# Patient Record
Sex: Female | Born: 1965 | Race: White | Hispanic: No | Smoking: Current every day smoker
Health system: Southern US, Community
[De-identification: ages and names within clinical notes are randomized; demographics above are authoritative.]

## PROBLEM LIST (undated history)

## (undated) DIAGNOSIS — E785 Hyperlipidemia, unspecified: Secondary | ICD-10-CM

## (undated) DIAGNOSIS — C4490 Unspecified malignant neoplasm of skin, unspecified: Secondary | ICD-10-CM

## (undated) HISTORY — PX: APPENDECTOMY: SHX54

## (undated) HISTORY — DX: Hyperlipidemia, unspecified: E78.5

## (undated) HISTORY — DX: Unspecified malignant neoplasm of skin, unspecified: C44.90

---

## 1993-10-28 HISTORY — PX: ABDOMINAL HYSTERECTOMY: SHX81

## 1993-11-27 LAB — HM PAP SMEAR: HM Pap smear: NORMAL

## 2005-07-18 ENCOUNTER — Other Ambulatory Visit: Payer: Self-pay

## 2005-07-18 ENCOUNTER — Emergency Department: Payer: Self-pay | Admitting: Unknown Physician Specialty

## 2007-02-19 ENCOUNTER — Emergency Department: Payer: Self-pay | Admitting: Emergency Medicine

## 2008-06-26 ENCOUNTER — Emergency Department: Payer: Self-pay | Admitting: Emergency Medicine

## 2008-08-09 ENCOUNTER — Ambulatory Visit: Payer: Self-pay | Admitting: Internal Medicine

## 2009-03-05 ENCOUNTER — Emergency Department: Payer: Self-pay | Admitting: Emergency Medicine

## 2010-10-25 ENCOUNTER — Emergency Department: Payer: Self-pay | Admitting: Emergency Medicine

## 2010-10-28 DIAGNOSIS — C4490 Unspecified malignant neoplasm of skin, unspecified: Secondary | ICD-10-CM

## 2010-10-28 HISTORY — DX: Unspecified malignant neoplasm of skin, unspecified: C44.90

## 2011-01-31 ENCOUNTER — Emergency Department: Payer: Self-pay | Admitting: Unknown Physician Specialty

## 2011-06-28 ENCOUNTER — Ambulatory Visit (INDEPENDENT_AMBULATORY_CARE_PROVIDER_SITE_OTHER): Payer: 59 | Admitting: Internal Medicine

## 2011-06-28 ENCOUNTER — Encounter: Payer: Self-pay | Admitting: Internal Medicine

## 2011-06-28 DIAGNOSIS — N959 Unspecified menopausal and perimenopausal disorder: Secondary | ICD-10-CM

## 2011-06-28 DIAGNOSIS — Z0000 Encounter for general adult medical examination without abnormal findings: Secondary | ICD-10-CM

## 2011-06-28 DIAGNOSIS — E785 Hyperlipidemia, unspecified: Secondary | ICD-10-CM

## 2011-06-28 LAB — COMPREHENSIVE METABOLIC PANEL
Albumin: 4.1 g/dL (ref 3.5–5.2)
BUN: 9 mg/dL (ref 6–23)
Calcium: 9.3 mg/dL (ref 8.4–10.5)
Chloride: 106 mEq/L (ref 96–112)
Glucose, Bld: 87 mg/dL (ref 70–99)
Potassium: 3.7 mEq/L (ref 3.5–5.1)

## 2011-06-28 LAB — LIPID PANEL: HDL: 42.2 mg/dL (ref >39.00)

## 2011-06-28 LAB — CBC WITH DIFFERENTIAL/PLATELET
Basophils Absolute: 0.1 10*3/uL (ref 0.0–0.1)
Eosinophils Absolute: 0.2 10*3/uL (ref 0.0–0.7)
Lymphocytes Relative: 29.4 % (ref 12.0–46.0)
Lymphs Abs: 3 10*3/uL (ref 0.7–4.0)
MCHC: 32.9 g/dL (ref 30.0–36.0)
Neutro Abs: 6.1 10*3/uL (ref 1.4–7.7)
RBC: 4.73 Mil/uL (ref 3.87–5.11)
RDW: 13.6 % (ref 11.5–14.6)
WBC: 10.1 10*3/uL (ref 4.5–10.5)

## 2011-06-28 LAB — LUTEINIZING HORMONE: LH: 54.8 m[IU]/mL

## 2011-06-28 LAB — TSH: TSH: 0.63 u[IU]/mL (ref 0.35–5.50)

## 2011-06-28 NOTE — Progress Notes (Signed)
Subjective:    Patient ID: Jane Francis, female    DOB: 1966/05/29, 45 y.o.   MRN: [id number]  HPI  [name] is a 45 year old female who presents to establish care. Her only complaint today is intermittent hot flashes. She thinks she may be entering menopause. She had a hysterectomy several years ago. She denies any other complaints aside from intermittent hot flashes. She does not have constipation, cold intolerance, weight gain.  [name] notes a history in the past of hyperlipidemia. She is not currently taking medications for this. She has been told in the past that the lesions around her eyes represent cholesterol deposits. She would like to have her cholesterol repeated today.  [name] also reports a history of skin cancer. Her daughter has noted an area on her left lower back which is red and scaling. They're concerned that this might be a new skin cancer.  No outpatient encounter prescriptions on file as of 06/28/2011.     Review of Systems  Constitutional: Positive for chills (hot flashes). Negative for fever, diaphoresis, activity change, appetite change, fatigue and unexpected weight change.  HENT: Negative for hearing loss, ear pain, nosebleeds, congestion, sore throat, facial swelling, rhinorrhea, sneezing, drooling, mouth sores, trouble swallowing, neck pain, neck stiffness, dental problem, voice change, postnasal drip, sinus pressure, tinnitus and ear discharge.   Eyes: Negative for photophobia, pain, discharge, redness, itching and visual disturbance.  Respiratory: Negative for apnea, cough, choking, chest tightness, shortness of breath, wheezing and stridor.   Cardiovascular: Negative for chest pain, palpitations and leg swelling.  Gastrointestinal: Negative for nausea, vomiting, abdominal pain, diarrhea, constipation, blood in stool, abdominal distention, anal bleeding and rectal pain.  Genitourinary: Negative for dysuria, urgency, frequency, hematuria, flank pain, decreased  urine volume, vaginal bleeding, vaginal discharge, enuresis, difficulty urinating, vaginal pain, menstrual problem, pelvic pain and dyspareunia.  Musculoskeletal: Negative for myalgias, back pain, joint swelling, arthralgias and gait problem.  Skin: Positive for rash (white deposits around eyes). Negative for color change and wound.  Neurological: Negative for dizziness, tremors, seizures, syncope, facial asymmetry, speech difficulty, weakness, light-headedness, numbness and headaches.  Hematological: Negative for adenopathy. Does not bruise/bleed easily.  Psychiatric/Behavioral: Negative for suicidal ideas, hallucinations, sleep disturbance, dysphoric mood and decreased concentration. The patient is not nervous/anxious.     BP 95/64  Pulse 77  Temp(Src) 98.3 F (36.8 C) (Oral)  Resp 12  Ht 5\' 6"  (1.676 m)  Wt 99 lb 12 oz (45.246 kg)  BMI 16.10 kg/m2  SpO2 100%     Objective:   Physical Exam  Constitutional: She is oriented to person, place, and time. She appears well-developed and well-nourished. No distress.  HENT:  Head: Normocephalic and atraumatic.  Right Ear: External ear normal.  Left Ear: External ear normal.  Nose: Nose normal.  Mouth/Throat: Oropharynx is clear and moist. No oropharyngeal exudate.  Eyes: Conjunctivae are normal. Pupils are equal, round, and reactive to light. Right eye exhibits no discharge. Left eye exhibits no discharge. No scleral icterus.  Neck: Normal range of motion. Neck supple. No tracheal deviation present. No thyromegaly present.  Cardiovascular: Normal rate, regular rhythm, normal heart sounds and intact distal pulses.  Exam reveals no gallop and no friction rub.   No murmur heard. Pulmonary/Chest: Effort normal and breath sounds normal. No respiratory distress. She has no wheezes. She has no rales. She exhibits no tenderness.  Abdominal: Soft. Bowel sounds are normal. She exhibits no distension and no mass. There is no tenderness. There is no  rebound and no guarding.  Musculoskeletal: Normal range of motion. She exhibits no edema and no tenderness.  Lymphadenopathy:    She has no cervical adenopathy.  Neurological: She is alert and oriented to person, place, and time. No cranial nerve deficit. She exhibits normal muscle tone. Coordination normal.  Skin: Skin is warm and dry. No rash noted. She is not diaphoretic. No erythema. No pallor.          Xanthomas around eyes, scaling erythematous lesion lower back c/w AK.  Psychiatric: She has a normal mood and affect. Her behavior is normal. Judgment and thought content normal.          Assessment & Plan:  1. Hyperlipidemia - Pt with h/o hyperlipidemia. Will check lipids with labs today. She will RTC in 1 month to review labs and discuss treatment.  2. Hot Flashes - Consistent with menopause. Will confirm with LH/FSH level. Will also check TSH to confirm no hypothyroidism.  3. Actinic keratosis - Has been followed by derm in past, several wide excisions for SCC. Will set up with Dr. Gwen Pounds for evaluation.

## 2011-06-28 NOTE — Patient Instructions (Signed)
Smoking Cessation This document explains the best ways for you to quit smoking and new treatments to help. It lists new medicines that can double or triple your chances of quitting and quitting for good. It also considers ways to avoid relapses and concerns you may have about quitting, including weight gain. NICOTINE: A POWERFUL ADDICTION If you have tried to quit smoking, you know how hard it can be. It is hard because nicotine is a very addictive drug. For some people, it can be as addictive as heroin or cocaine. Usually, people make 2 or 3 tries, or more, before finally being able to quit. Each time you try to quit, you can learn about what helps and what hurts. Quitting takes hard work and a lot of effort, but you can quit smoking. QUITTING SMOKING IS ONE OF THE MOST IMPORTANT THINGS YOU WILL EVER DO:  You will live longer, feel better, and live better.   The impact on your body of quitting smoking is felt almost immediately:   Within 20 minutes, blood pressure decreases. Pulse returns to its normal level.   After 8 hours, carbon monoxide levels in the blood return to normal. Oxygen level increases.   After 24 hours, chance of heart attack starts to decrease. Breath, hair, and body stop smelling like smoke.   After 48 hours, damaged nerve endings begin to recover. Sense of taste and smell improve.   After 72 hours, the body is virtually free of nicotine. Bronchial tubes relax and breathing becomes easier.   After 2 to 12 weeks, lungs can hold more air. Exercise becomes easier and circulation improves.   Quitting will lower your chance of having a heart attack, stroke, cancer, or lung disease:   After 1 year, the risk of coronary heart disease is cut in half.   After 5 years, the risk of stroke falls to the same as a nonsmoker.   After 10 years, the risk of lung cancer is cut in half and the risk of other cancers decreases significantly.   After 15 years, the risk of coronary heart  disease drops, usually to the level of a nonsmoker.   If you are pregnant, quitting smoking will improve your chances of having a healthy baby.   The people you live with, especially your children, will be healthier.   You will have extra money to spend on things other than cigarettes.  FIVE KEYS TO QUITTING Studies have shown that these 5 steps will help you quit smoking and quit for good. You have the best chances of quitting if you use them together: 1. Get ready.  2. Get support and encouragement.  3. Learn new skills and behaviors.  4. Get medicine to reduce your nicotine addiction and use it correctly.  5. Be prepared for relapse or difficult situations. Be determined to continue trying to quit, even if you do not succeed at first.  1. GET READY  Set a quit date.   Change your environment.   Get rid of ALL cigarettes, ashtrays, matches, and lighters in your home, car, and place of work.   Do not let people smoke in your home.   Review your past attempts to quit. Think about what worked and what did not.   Once you quit, do not smoke. NOT EVEN A PUFF!  2. GET SUPPORT AND ENCOURAGEMENT Studies have shown that you have a better chance of being successful if you have help. You can get support in many ways.  Tell   your family, friends, and coworkers that you are going to quit and need their support. Ask them not to smoke around you.   Talk to your caregivers (doctor, dentist, nurse, pharmacist, psychologist, and/or smoking counselor).   Get individual, group, or telephone counseling and support. The more counseling you have, the better your chances are of quitting. Programs are available at local hospitals and health centers. Call your local health department for information about programs in your area.   Spiritual beliefs and practices may help some smokers quit.   Quit meters are small computer programs online or downloadable that keep track of quit statistics, such as amount  of "quit-time," cigarettes not smoked, and money saved.   Many smokers find one or more of the many self-help books available useful in helping them quit and stay off tobacco.  3. LEARN NEW SKILLS AND BEHAVIORS  Try to distract yourself from urges to smoke. Talk to someone, go for a walk, or occupy your time with a task.   When you first try to quit, change your routine. Take a different route to work. Drink tea instead of coffee. Eat breakfast in a different place.   Do something to reduce your stress. Take a hot bath, exercise, or read a book.   Plan something enjoyable to do every day. Reward yourself for not smoking.   Explore interactive web-based programs that specialize in helping you quit.  4. GET MEDICINE AND USE IT CORRECTLY Medicines can help you stop smoking and decrease the urge to smoke. Combining medicine with the above behavioral methods and support can quadruple your chances of successfully quitting smoking. The [place] Food and Drug Administration (FDA) has approved 7 medicines to help you quit smoking. These medicines fall into 3 categories.  Nicotine replacement therapy (delivers nicotine to your body without the negative effects and risks of smoking):   Nicotine gum: Available over-the-counter.   Nicotine lozenges: Available over-the-counter.   Nicotine inhaler: Available by prescription.   Nicotine nasal spray: Available by prescription.   Nicotine skin patches (transdermal): Available by prescription and over-the-counter.   Antidepressant medicine (helps people abstain from smoking, but how this works is unknown):   Bupropion sustained-release (SR) tablets: Available by prescription.   Nicotinic receptor partial agonist (simulates the effect of nicotine in your brain):   Varenicline tartrate tablets: Available by prescription.   Ask your caregiver for advice about which medicines to use and how to use them. Carefully read the information on the package.    Everyone who is trying to quit may benefit from using a medicine. If you are pregnant or trying to become pregnant, nursing an infant, you are under age 18, or you smoke fewer than 10 cigarettes per day, talk to your caregiver before taking any nicotine replacement medicines.   You should stop using a nicotine replacement product and call your caregiver if you experience nausea, dizziness, weakness, vomiting, fast or irregular heartbeat, mouth problems with the lozenge or gum, or redness or swelling of the skin around the patch that does not go away.   Do not use any other product containing nicotine while using a nicotine replacement product.   Talk to your caregiver before using these products if you have diabetes, heart disease, asthma, stomach ulcers, you had a recent heart attack, you have high blood pressure that is not controlled with medicine, a history of irregular heartbeat, or you have been prescribed medicine to help you quit smoking.  5. BE PREPARED FOR RELAPSE OR   DIFFICULT SITUATIONS  Most relapses occur within the first 3 months after quitting. Do not be discouraged if you start smoking again. Remember, most people try several times before they finally quit.   You may have symptoms of withdrawal because your body is used to nicotine. You may crave cigarettes, be irritable, feel very hungry, cough often, get headaches, or have difficulty concentrating.   The withdrawal symptoms are only temporary. They are strongest when you first quit, but they will go away within 10 to 14 days.  Here are some difficult situations to watch for:  Alcohol. Avoid drinking alcohol. Drinking lowers your chances of successfully quitting.   Caffeine. Try to reduce the amount of caffeine you consume. It also lowers your chances of successfully quitting.   Other smokers. Being around smoking can make you want to smoke. Avoid smokers.   Weight gain. Many smokers will gain weight when they quit, usually  less than 10 pounds. Eat a healthy diet and stay active. Do not let weight gain distract you from your main goal, quitting smoking. Some medicines that help you quit smoking may also help delay weight gain. You can always lose the weight gained after you quit.   Bad mood or depression. There are a lot of ways to improve your mood other than smoking.  If you are having problems with any of these situations, talk to your caregiver. SPECIAL SITUATIONS OR CONDITIONS Studies suggest that everyone can quit smoking. Your situation or condition can give you a special reason to quit.  Pregnant women/New mothers: By quitting, you protect your baby's health and your own.   Hospitalized patients: By quitting, you reduce health problems and help healing.   Heart attack patients: By quitting, you reduce your risk of a second heart attack.   Lung, head, and neck cancer patients: By quitting, you reduce your chance of a second cancer.   Parents of children and adolescents: By quitting, you protect your children from illnesses caused by secondhand smoke.  QUESTIONS TO THINK ABOUT Think about the following questions before you try to stop smoking. You may want to talk about your answers with your caregiver.  Why do you want to quit?   If you tried to quit in the past, what helped and what did not?   What will be the most difficult situations for you after you quit? How will you plan to handle them?   Who can help you through the tough times? Your family? Friends? Caregiver?   What pleasures do you get from smoking? What ways can you still get pleasure if you quit?  Here are some questions to ask your caregiver:  How can you help me to be successful at quitting?   What medicine do you think would be best for me and how should I take it?   What should I do if I need more help?   What is smoking withdrawal like? How can I get information on withdrawal?  Quitting takes hard work and a lot of effort,  but you can quit smoking. FOR MORE INFORMATION Smokefree.gov (http://www.smokefree.gov) provides free, accurate, evidence-based information and professional assistance to help support the immediate and long-term needs of people trying to quit smoking. Document Released: 10/08/2001 Document Re-Released: 04/03/2010 [employer] Patient Information 2011 [employer]. 

## 2011-07-11 ENCOUNTER — Telehealth: Payer: Self-pay | Admitting: Internal Medicine

## 2011-07-11 NOTE — Telephone Encounter (Signed)
Yes, it is fine to set her up with Dr. Gwen Pounds at [facility]. I have been entering this type of referrals under ambulatory referral. But, maybe I did not enter this one correctly?

## 2011-07-12 ENCOUNTER — Encounter: Payer: Self-pay | Admitting: Internal Medicine

## 2011-07-12 ENCOUNTER — Ambulatory Visit (INDEPENDENT_AMBULATORY_CARE_PROVIDER_SITE_OTHER): Payer: 59 | Admitting: Internal Medicine

## 2011-07-12 VITALS — BP 96/56 | HR 91 | Temp 98.1°F | Resp 12 | Ht 66.0 in | Wt 104.0 lb

## 2011-07-12 DIAGNOSIS — N951 Menopausal and female climacteric states: Secondary | ICD-10-CM

## 2011-07-12 DIAGNOSIS — F17200 Nicotine dependence, unspecified, uncomplicated: Secondary | ICD-10-CM

## 2011-07-12 DIAGNOSIS — R232 Flushing: Secondary | ICD-10-CM

## 2011-07-12 DIAGNOSIS — Z720 Tobacco use: Secondary | ICD-10-CM

## 2011-07-12 DIAGNOSIS — L989 Disorder of the skin and subcutaneous tissue, unspecified: Secondary | ICD-10-CM

## 2011-07-12 DIAGNOSIS — E785 Hyperlipidemia, unspecified: Secondary | ICD-10-CM

## 2011-07-12 MED ORDER — ATORVASTATIN CALCIUM 20 MG PO TABS: 20.0000 mg | ORAL_TABLET | Freq: Every day | ORAL | Status: AC

## 2011-07-12 MED ORDER — SERTRALINE HCL 25 MG PO TABS: 25.0000 mg | ORAL_TABLET | Freq: Every day | ORAL | Status: AC

## 2011-07-12 NOTE — Patient Instructions (Signed)
Labs in 1 month. Follow up in 1 month.

## 2011-07-12 NOTE — Progress Notes (Signed)
Subjective:    Patient ID: Jane Francis, female    DOB: 1966-08-15, 45 y.o.   MRN: [id number]  HPI Jane Francis is a 45 year old female who presents to followup. Her main concern today again is hot flashes. She reports they occur throughout the day. They affect both her work and her sleep. At her last visit we checked labs including LH and [facility] which were both noted to be elevated consistent with menopause. She comes today to discuss potential treatment for hot flashes.  Also on her lab work performed at her last visit she was noted to have markedly elevated cholesterol. Her LDL cholesterol was elevated at 220. She reports a strong family history of high cholesterol. She notes that she has been told on several occasions that the white deposits underneath her eyes are cholesterol deposits. She would like to discuss starting cholesterol medication today.    Review of Systems  Constitutional: Positive for chills (hot flashes). Negative for fever, diaphoresis, activity change, appetite change, fatigue and unexpected weight change.  Respiratory: Negative for shortness of breath.   Cardiovascular: Negative for chest pain, palpitations and leg swelling.  Musculoskeletal: Negative for myalgias.  Skin: Positive for rash (white deposits around eyes). Negative for color change and wound.  Psychiatric/Behavioral: Negative for suicidal ideas and sleep disturbance. The patient is not nervous/anxious.    BP 96/56  Pulse 91  Temp(Src) 98.1 F (36.7 C) (Oral)  Resp 12  Ht 5\' 6"  (1.676 m)  Wt 104 lb (47.174 kg)  BMI 16.79 kg/m2  SpO2 99%     Objective:   Physical Exam  Constitutional: She is oriented to person, place, and time. She appears well-developed and well-nourished. No distress.  HENT:  Head: Normocephalic and atraumatic.  Right Ear: External ear normal.  Left Ear: External ear normal.  Nose: Nose normal.  Eyes: Conjunctivae are normal. Pupils are equal, round, and reactive to light. Right eye  exhibits no discharge. Left eye exhibits no discharge. No scleral icterus.  Neck: Normal range of motion. Neck supple. No tracheal deviation present. No thyromegaly present.  Cardiovascular: Normal rate, regular rhythm, normal heart sounds and intact distal pulses.  Exam reveals no gallop and no friction rub.   No murmur heard. Pulmonary/Chest: Effort normal and breath sounds normal. No respiratory distress. She has no wheezes. She has no rales. She exhibits no tenderness.  Abdominal: Soft.  Musculoskeletal: Normal range of motion. She exhibits no edema and no tenderness.  Lymphadenopathy:    She has no cervical adenopathy.  Neurological: She is alert and oriented to person, place, and time. No cranial nerve deficit. She exhibits normal muscle tone. Coordination normal.  Skin: Skin is warm and dry. No rash noted. She is not diaphoretic. No erythema. No pallor.          Xanthomas around eyes, scaling erythematous lesion upper and lower back  Psychiatric: She has a normal mood and affect. Her behavior is normal. Judgment and thought content normal.          Assessment & Plan:  1. Hot flashes - patient with hot flashes secondary to menopause. She is not a good candidate for estrogen replacement because of ongoing tobacco use. We will try using an SSRI to help with hot flashes. We'll start her on Zoloft 25 mg daily. She will return to clinic in one month to assess progress.  2. 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 We discussed dietary changes including reducing saturated fat and increasing  intake of fiber. We also discussed the need for smoking cessation. We will start her on Lipitor 20 mg daily. I suspect we will need to increase this dose over time. If she has no improvement we discussed referral to Dr. Eligah East at [facility]. She will have lipids and LFTs checked in one month.  3. Tobacco use - patient with ongoing tobacco use. She is a  perihilar risk of coronary artery disease given her familial hyperlipidemia and ongoing tobacco use. I strongly encouraged her to quit smoking today. We reviewed several different options to help her with smoking cessation. These included nicotine replacement, Wellbutrin, Chantix. She will think about this.  4. Skin lesions - patient with several skin lesions on her left upper and lower back which are consistent with actinic keratoses or early squamous cell carcinomas. We will set her up referral to dermatology for evaluation.

## 2011-07-26 NOTE — Telephone Encounter (Signed)
Patient has an appointment on Nov. 1 at 10:45 with Dr. Lowella Bandy at [facility] skin.  I contacted patient to let her know. Patient was giving the number to reschedule she wanted a later appointment.

## 2011-07-30 ENCOUNTER — Telehealth: Payer: Self-pay | Admitting: Internal Medicine

## 2011-07-30 NOTE — Telephone Encounter (Signed)
Patient would like to have an rx called in for her congestion. Please call with advise.

## 2011-07-31 NOTE — Telephone Encounter (Signed)
Would recommend Sudafed 60mg  3 times daily or the extended release 120mg  twice daily. These are OTC

## 2011-08-01 NOTE — Telephone Encounter (Signed)
Left message asking patient to return my call.

## 2011-08-02 NOTE — Telephone Encounter (Signed)
Patient notified

## 2011-08-09 ENCOUNTER — Other Ambulatory Visit: Payer: 59

## 2011-08-20 ENCOUNTER — Ambulatory Visit: Payer: Self-pay | Admitting: Internal Medicine

## 2011-08-23 ENCOUNTER — Telehealth: Payer: Self-pay | Admitting: Internal Medicine

## 2011-08-23 NOTE — Telephone Encounter (Signed)
Patient wanted to know her mammogram results from last week.  Please advise.

## 2011-08-23 NOTE — Telephone Encounter (Signed)
I think I sent you a message on this that her mammogram was normal? Can you check your staff messages?

## 2011-08-23 NOTE — Telephone Encounter (Signed)
Let her Her husband Gerlene Burdock know that her mammogram was normal.

## 2011-09-06 ENCOUNTER — Encounter: Payer: 59 | Admitting: Internal Medicine

## 2011-09-16 ENCOUNTER — Encounter: Payer: Self-pay | Admitting: Internal Medicine

## 2013-02-21 LAB — CBC
HCT: 35.3 % (ref 35.0–47.0)
MCHC: 33.6 g/dL (ref 32.0–36.0)
MCV: 89 fL (ref 80–100)
Platelet: 303 10*3/uL (ref 150–440)
WBC: 12.5 10*3/uL — ABNORMAL HIGH (ref 3.6–11.0)

## 2013-02-21 LAB — COMPREHENSIVE METABOLIC PANEL
Albumin: 3.4 g/dL (ref 3.4–5.0)
Anion Gap: 7 (ref 7–16)
Calcium, Total: 8.5 mg/dL (ref 8.5–10.1)
Co2: 28 mmol/L (ref 21–32)
EGFR (Non-African Amer.): >60
SGPT (ALT): 16 U/L (ref 12–78)
Sodium: 142 mmol/L (ref 136–145)
Total Protein: 6.4 g/dL (ref 6.4–8.2)

## 2013-02-21 LAB — DRUG SCREEN, URINE
Amphetamines, Ur Screen: NEGATIVE (ref ?–1000)
Barbiturates, Ur Screen: NEGATIVE (ref ?–200)
Benzodiazepine, Ur Scrn: POSITIVE (ref ?–200)
Cannabinoid 50 Ng, Ur ~~LOC~~: NEGATIVE (ref ?–50)
Cocaine Metabolite,Ur ~~LOC~~: NEGATIVE (ref ?–300)
Methadone, Ur Screen: NEGATIVE (ref ?–300)
Phencyclidine (PCP) Ur S: NEGATIVE (ref ?–25)

## 2013-02-21 LAB — SALICYLATE LEVEL: Salicylates, Serum: 3.6 mg/dL — ABNORMAL HIGH

## 2013-02-21 LAB — TSH: Thyroid Stimulating Horm: 1.88 u[IU]/mL

## 2013-02-22 ENCOUNTER — Inpatient Hospital Stay: Payer: Self-pay | Admitting: Psychiatry

## 2013-02-22 LAB — SALICYLATE LEVEL: Salicylates, Serum: 2.5 mg/dL

## 2013-02-24 LAB — LIPID PANEL
Cholesterol: 263 mg/dL — ABNORMAL HIGH (ref 0–200)
HDL Cholesterol: 33 mg/dL — ABNORMAL LOW (ref 40–60)
Ldl Cholesterol, Calc: 204 mg/dL — ABNORMAL HIGH (ref 0–100)
VLDL Cholesterol, Calc: 26 mg/dL (ref 5–40)

## 2013-11-15 ENCOUNTER — Emergency Department: Payer: Self-pay | Admitting: Emergency Medicine

## 2015-02-17 NOTE — H&P (Signed)
PATIENT NAME:  Jane Francis, AYDIN MR#:  [id number] DATE OF BIRTH:  [birth year]  DATE OF ADMISSION:  02/22/2013 DATE OF EVALUATION:  02/23/2013  IDENTIFYING INFORMATION: A 49 year old woman presented to the Emergency Room stating that she was feeling so hopeless that she wished she would die and had taken an excessive amount of Xanax.   CHIEF COMPLAINT: "I need detox."   HISTORY OF PRESENT ILLNESS: The patient states that she has been abusing narcotics and benzodiazepines for several years and has gotten fed up with it. She estimates her usual daily intake as being 80 mg of methadone, plus about 6 Percocets a day, as well as about 4 mg of Xanax a day. She has tried to stop in the past but has been unable to go through withdrawal for even a day or so. She feels like it is getting out of control. She feels ashamed of herself, feels ashamed in front of her children and grandchildren. Mood is down and negative. She denies to me today, however, having any suicidal ideation and denies that she was actually trying to hurt herself when she took the Xanax the other day. She denies any psychotic symptoms. She has been having difficult and interrupted sleep. Appetite is low, and she has lost a significant amount of weight. Currently, she is having multiple symptoms of opiate withdrawal including diffuse body pain, especially in the lower extremity muscles and the upper arm muscles. She is also having nausea and diarrhea, feeling shaky all over, feeling nervous.   PAST PSYCHIATRIC HISTORY: She states that about 20 years ago she was hospitalized briefly for depression but denies that she has ever tried to kill herself. She has not recently been on any treatment for depression. She does not report any history of psychotic symptoms. She has never been in any kind of substance abuse treatment program whatsoever.   SOCIAL HISTORY: She does not work. She lives with her husband. She says that her husband also abuses narcotics,  although he thinks that he does not have a problem with it. The patient has adult children and several grandchildren in the area and feels embarrassed of herself because of her substance abuse problem.   PAST MEDICAL HISTORY: She has a history of very high cholesterol. She did not quote a number to me, but she has cholesterol deposits around her eyes. She has not been taking her Lipitor recently and is worried about it. She does not have any other known ongoing medical problems but has been losing weight, probably due to her poor p.o. intake. 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 No other cancer.   FAMILY HISTORY: Positive for substance abuse.   CURRENT MEDICATIONS: Nothing that is prescribed.   ALLERGIES: No known drug allergies.   SUBSTANCE ABUSE HISTORY: Does not drink alcohol regularly. Does not use marijuana. Drugs of abuse are primarily narcotics and benzodiazepines as described above. No history of seizures or DTs from withdrawal.   REVIEW OF SYSTEMS: Currently complains of pain diffusely, especially in her upper legs and upper arms. Feels shaky all over, nauseated, mildly dizzy.  Poor p.o. intake, diarrhea. Running nose, feeling chilled.  She also has a low mood, mildly hopeless, but denies acute suicidal ideation, denies any psychotic symptoms.   MENTAL STATUS EXAM: A casually dressed, neatly groomed woman, looks her stated age or older. Cooperative with the interview. Good eye contact. Psychomotor activity slow and sluggish. Affect flat and anxious. Mood stated  as being nervous. Thoughts are lucid. No evidence of loosening of associations or delusions. Denies auditory or visual hallucinations. Denies any recent suicidal or homicidal ideation. Judgment and insight are improved given that she has come in for treatment. Intelligence appears to be average. Short and long-term memory are grossly intact.   PHYSICAL EXAMINATION: GENERAL: The patient  weighs 106 pounds, does not look emaciated but does look on the thin side. She has cholesterol deposits around both of her eyes pretty extensively. She has old scars from where she has had basal cell tumors removed. No other acute skin lesions.  HEENT: Pupils are equal and reactive. Face is symmetric. Teeth are in less than optimal condition but do not look to be acutely infected.  NECK AND BACK: Not specifically tender. She does walk like she is feeling a little arthritic.  MUSCULOSKELETAL: Full range of motion at all extremities. Normal strength and reflexes throughout. Cranial nerves symmetric and normal.  LUNGS: Clear without wheezes.  HEART: Regular rate and rhythm.  ABDOMEN: Soft, nontender, normal bowel sounds.  VITAL SIGNS: Most recent vitals show temperature 98.7, pulse 86, respirations 18, blood pressure 102/53.   LABORATORY RESULTS: Drug screen positive for opiates and benzodiazepines. TSH normal at 1.88. Chemistry panel: Low creatinine at 0.56, low AST at 12. CBC shows a white count slightly elevated at 12.5. Salicylates slightly elevated at 3.6 but not toxic. Acetaminophen normal.   ASSESSMENT: A 49 year old woman with opiate and benzodiazepine dependence and probably substance-induced mood disorder versus depression. Needs detox in order to get into a sustained recovery.   TREATMENT PLAN: As I have described to her, the opiate detox is more uncomfortable perhaps but Xanax withdrawal  may be more dangerous. I put her on the usual detox protocol to use Ativan for opiate withdrawal. She has also been prescribed Robaxin 1500 mg q. 6 hours p.r.n. for pain, Lomotil p.r.n. for diarrhea, Zofran p.r.n. for nausea. Clonidine patch will be started although we will have to watch her blood pressure. The patient has been engaged in groups and activities on the unit for substance abuse treatment. She has a bed available for tomorrow [facility] [place] [facility] [place], and we are planning to  transfer her tomorrow for more specific substance abuse treatment. She has also been started back on her Lipitor at 40 mg at night.   DIAGNOSIS, PRINCIPAL AND PRIMARY:  AXIS I: Substance-induced depression.   SECONDARY DIAGNOSES: AXIS I:  1.  Opiate dependence.  2.  Benzodiazepine dependence.   AXIS II: Deferred.   AXIS III: Opiate withdrawal, benzodiazepine withdrawal, hypercholesterolemia, probably familial, history of basal cell cancers.   AXIS IV: Severe from substance abuse problem.     AXIS V: Functioning at time of evaluation 34.  ____________________________ Gonzella Lex, MD jtc:cb D: 02/23/2013 14:51:42 ET T: 02/23/2013 15:06:26 ET JOB#: [id number]  cc: Gonzella Lex, MD, <Dictator> Gonzella Lex MD ELECTRONICALLY SIGNED 02/24/2013 18:51

## 2015-02-17 NOTE — Discharge Summary (Signed)
PATIENT NAME:  Jane Francis, Jane Francis MR#:  [id number] DATE OF BIRTH:  [birth year]  DATE OF ADMISSION:  02/22/2013 DATE OF DISCHARGE:  02/24/2013  The patient is being transferred to alcohol and drug abuse treatment center.   HOSPITAL COURSE: This 49 year old woman was admitted to the hospital under involuntary commitment because of opiate dependence and benzodiazepine dependence, accompanied by depression and passive suicidal ideation. The patient was having multiple physical symptoms of withdrawal from both Xanax and methadone and Percocet. She was treated with stabilizing medicines to decrease withdrawal symptoms. She had psychoeducation and medication education and was involved in groups and therapy. She did not have any seizures and did not display any signs of delirium but continued to endorse symptoms of nausea, deep pain in her muscles, diarrhea, fatigue, tremulousness. The patient has been accepted to the alcohol and drug abuse treatment center at [facility]. She is agreeable to the transfer but is under involuntary commitment and will be transferred there by sheriffs tomorrow. The patient also has a history of familial hypercholesterolemia and was restarted on Lipitor. Results of lipid panel are still pending.   MENTAL STATUS AT DISCHARGE: Casually dressed, reasonably well-groomed woman, looks older than her stated age. Cooperative with the interview. Good eye contact. Mild tremor. Speech decreased in rate. Affect flat. Mood stated as being nervous. Thoughts appear generally lucid. No current suicidal or homicidal ideation. Reasonably good insight and judgment. Normal intelligence.   LABORATORY RESULTS: A drug screen was positive for benzodiazepines and opiates. TSH normal at 1.88, alcohol undetectable, chemistry showed a low creatinine at 0.56, AST low at 12. CBC showed an elevated white count at 12.5, low hemoglobin at 11.9.   MEDICATIONS AT DISCHARGE: Lorazepam on a detox protocol, currently still at 2 mg  q.1 to 2 hours p.r.n. for signs of withdrawal. Robaxin 1500 mg q.6 hours p.r.n. pain, nicotine patch 21 mg transdermal daily, thiamine 100 mg per day, Lipitor 40 mg at night, clonidine patch 0.1 mg per week, Lomotil 1 tablet q.6 hours p.r.n. diarrhea, thiamine 50 mg per day.   DISPOSITION: The patient will be discharged to the alcohol and drug abuse treatment center.   DIAGNOSIS, PRINCIPAL AND PRIMARY:  AXIS I: Opiate dependence.   SECONDARY DIAGNOSES:  AXIS I:  1. Benzodiazepine dependence.  2. Substance-induced mood disorder.  AXIS II: Deferred.  AXIS III: Hypercholesterolemia, opiate withdrawal, benzodiazepine withdrawal.  AXIS IV: Severe from chronic substance abuse and unsupportive environment.  AXIS V: Functioning at time of discharge 48.    ____________________________ Gonzella Lex, MD jtc:gb D: 02/24/2013 00:11:00 ET T: 02/24/2013 01:05:44 ET JOB#: [id number]  cc: Gonzella Lex, MD, <Dictator> Gonzella Lex MD ELECTRONICALLY SIGNED 02/24/2013 18:51

## 2015-02-17 NOTE — Discharge Summary (Signed)
PATIENT NAME:  Jane Francis, Jane Francis MR#:  [id number] DATE OF BIRTH:  1965/11/02  DATE OF ADMISSION:  02/22/2013 DATE OF DISCHARGE:    The patient is being transferred to alcohol and drug abuse treatment center.   HOSPITAL COURSE: This 49 year old woman was admitted to the hospital under involuntary commitment because of opiate dependence and benzodiazepine dependence, accompanied by depression and passive suicidal ideation. The patient was having multiple physical symptoms of withdrawal from both Xanax and methadone and Percocet. She was treated with stabilizing medicines to decrease withdrawal symptoms. She had psychoeducation and medication education and was involved in groups and therapy. She did not have any seizures and did not display any signs of delirium but continued to endorse symptoms of nausea, deep pain in her muscles, diarrhea, fatigue, tremulousness. The patient has been accepted to the alcohol and drug abuse treatment center at [facility]. She is agreeable to the transfer but is under involuntary commitment and will be transferred there by sheriffs tomorrow. The patient also has a history of familial hypercholesterolemia and was restarted on Lipitor. Results of lipid panel are still pending.   MENTAL STATUS AT DISCHARGE: Casually dressed, reasonably well-groomed woman, looks older than her stated age. Cooperative with the interview. Good eye contact. Mild tremor. Speech decreased in rate. Affect flat. Mood stated as being nervous. Thoughts appear generally lucid. No current suicidal or homicidal ideation. Reasonably good insight and judgment. Normal intelligence.   LABORATORY RESULTS: A drug screen was positive for benzodiazepines and opiates. TSH normal at 1.88, alcohol undetectable, chemistry showed a low creatinine at 0.56, AST low at 12. CBC showed an elevated white count at 12.5, low hemoglobin at 11.9.   MEDICATIONS AT DISCHARGE: Lorazepam on a detox protocol, currently still at 2 mg q.1 to 2  hours p.r.n. for signs of withdrawal. Robaxin 1500 mg q.6 hours p.r.n. pain, nicotine patch 21 mg transdermal daily, thiamine 100 mg per day, Lipitor 40 mg at night, clonidine patch 0.1 mg per week, Lomotil 1 tablet q.6 hours p.r.n. diarrhea, thiamine 50 mg per day.   DISPOSITION: The patient will be discharged to the alcohol and drug abuse treatment center.   DIAGNOSIS, PRINCIPAL AND PRIMARY:  AXIS I: Opiate dependence.   SECONDARY DIAGNOSES:  AXIS I:  1. Benzodiazepine dependence.  2. Substance-induced mood disorder.  AXIS II: Deferred.  AXIS III: Hypercholesterolemia, opiate withdrawal, benzodiazepine withdrawal.  AXIS IV: Severe from chronic substance abuse and unsupportive environment.  AXIS V: Functioning at time of discharge 48.    ____________________________ Gonzella Lex, MD jtc:gb D: 02/24/2013 00:11:56 ET T: 02/24/2013 01:05:44 ET JOB#: [id number]  cc: Gonzella Lex, MD, <Dictator>

## 2016-01-28 ENCOUNTER — Encounter: Payer: Self-pay | Admitting: Emergency Medicine

## 2016-01-28 ENCOUNTER — Emergency Department: Payer: Medicaid - Out of State

## 2016-01-28 ENCOUNTER — Emergency Department
Admission: EM | Admit: 2016-01-28 | Discharge: 2016-01-29 | Disposition: A | Discharge status: Home / Self Care | Payer: Medicaid - Out of State | Attending: Emergency Medicine | Admitting: Emergency Medicine

## 2016-01-28 DIAGNOSIS — E785 Hyperlipidemia, unspecified: Secondary | ICD-10-CM | POA: Diagnosis not present

## 2016-01-28 DIAGNOSIS — F17210 Nicotine dependence, cigarettes, uncomplicated: Secondary | ICD-10-CM | POA: Diagnosis not present

## 2016-01-28 DIAGNOSIS — T50901A Poisoning by unspecified drugs, medicaments and biological substances, accidental (unintentional), initial encounter: Secondary | ICD-10-CM

## 2016-01-28 DIAGNOSIS — N390 Urinary tract infection, site not specified: Secondary | ICD-10-CM | POA: Insufficient documentation

## 2016-01-28 DIAGNOSIS — Z79899 Other long term (current) drug therapy: Secondary | ICD-10-CM | POA: Insufficient documentation

## 2016-01-28 DIAGNOSIS — T43211A Poisoning by selective serotonin and norepinephrine reuptake inhibitors, accidental (unintentional), initial encounter: Secondary | ICD-10-CM | POA: Diagnosis not present

## 2016-01-28 DIAGNOSIS — R4182 Altered mental status, unspecified: Secondary | ICD-10-CM | POA: Diagnosis present

## 2016-01-28 DIAGNOSIS — Z85828 Personal history of other malignant neoplasm of skin: Secondary | ICD-10-CM | POA: Diagnosis not present

## 2016-01-28 LAB — CBC WITH DIFFERENTIAL/PLATELET
BASOS ABS: 0.1 10*3/uL (ref 0–0.1)
BASOS PCT: 1 %
Eosinophils Absolute: 0.7 10*3/uL (ref 0–0.7)
Eosinophils Relative: 5 %
HEMATOCRIT: 37.7 % (ref 35.0–47.0)
Hemoglobin: 12.5 g/dL (ref 12.0–16.0)
LYMPHS PCT: 42 %
Lymphs Abs: 6 10*3/uL — ABNORMAL HIGH (ref 1.0–3.6)
MCH: 28.3 pg (ref 26.0–34.0)
MCHC: 33.1 g/dL (ref 32.0–36.0)
MCV: 85.5 fL (ref 80.0–100.0)
Monocytes Absolute: 1.3 10*3/uL — ABNORMAL HIGH (ref 0.2–0.9)
Monocytes Relative: 9 %
NEUTROS ABS: 6.1 10*3/uL (ref 1.4–6.5)
Neutrophils Relative %: 43 %
PLATELETS: 296 10*3/uL (ref 150–440)
RBC: 4.41 MIL/uL (ref 3.80–5.20)
RDW: 13.7 % (ref 11.5–14.5)
WBC: 14.2 10*3/uL — AB (ref 3.6–11.0)

## 2016-01-28 LAB — COMPREHENSIVE METABOLIC PANEL
ALBUMIN: 3.9 g/dL (ref 3.5–5.0)
ALT: 12 U/L — AB (ref 14–54)
AST: 19 U/L (ref 15–41)
Alkaline Phosphatase: 86 U/L (ref 38–126)
Anion gap: 5 (ref 5–15)
BILIRUBIN TOTAL: 0.2 mg/dL — AB (ref 0.3–1.2)
BUN: 13 mg/dL (ref 6–20)
CHLORIDE: 107 mmol/L (ref 101–111)
CO2: 23 mmol/L (ref 22–32)
CREATININE: 0.66 mg/dL (ref 0.44–1.00)
Calcium: 9 mg/dL (ref 8.9–10.3)
GFR calc Af Amer: >60 mL/min (ref >60)
Glucose, Bld: 91 mg/dL (ref 65–99)
POTASSIUM: 3.3 mmol/L — AB (ref 3.5–5.1)
Sodium: 135 mmol/L (ref 135–145)
Total Protein: 6.8 g/dL (ref 6.5–8.1)
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
## 2016-01-28 NOTE — ED Notes (Signed)
Patient transported to CT via stretcher with CT tech. °

## 2016-01-28 NOTE — ED Notes (Addendum)
EMS pt to rm 25 from home with report of ams and slurred speech. Pt alert and talking with noted slurred speech on arrival. Oriented to place but confused to date.Pt follows all commands but at times slowly. No neurological deficits noted. PERRLA. Pt has hx of benzo use and opiate use and was given nrcan by ems with no change in status.

## 2016-01-29 LAB — URINE DRUG SCREEN, QUALITATIVE (ARMC ONLY)
AMPHETAMINES, UR SCREEN: NOT DETECTED
BARBITURATES, UR SCREEN: NOT DETECTED
BENZODIAZEPINE, UR SCRN: NOT DETECTED
Cannabinoid 50 Ng, Ur ~~LOC~~: NOT DETECTED
Cocaine Metabolite,Ur ~~LOC~~: NOT DETECTED
MDMA (Ecstasy)Ur Screen: NOT DETECTED
METHADONE SCREEN, URINE: NOT DETECTED
Opiate, Ur Screen: POSITIVE — AB
Phencyclidine (PCP) Ur S: NOT DETECTED
TRICYCLIC, UR SCREEN: POSITIVE — AB

## 2016-01-29 LAB — URINALYSIS COMPLETE WITH MICROSCOPIC (ARMC ONLY)
Bacteria, UA: NONE SEEN
Bilirubin Urine: NEGATIVE
GLUCOSE, UA: NEGATIVE mg/dL
Hgb urine dipstick: NEGATIVE
KETONES UR: NEGATIVE mg/dL
NITRITE: NEGATIVE
PROTEIN: NEGATIVE mg/dL
SPECIFIC GRAVITY, URINE: 1.009 (ref 1.005–1.030)
pH: 5 (ref 5.0–8.0)

## 2016-01-29 LAB — ETHANOL

## 2016-01-29 MED ORDER — DEXTROSE 5 % IV SOLN
1.0000 g | INTRAVENOUS | Status: AC
  Administered 2016-01-29: 1 g via INTRAVENOUS
  Filled 2016-01-29: qty 10

## 2016-01-29 MED ORDER — CEPHALEXIN 500 MG PO CAPS: 500.0000 mg | ORAL_CAPSULE | Freq: Two times a day (BID) | ORAL | Status: AC

## 2016-01-29 MED ORDER — SODIUM CHLORIDE 0.9 % IV SOLN
Freq: Once | INTRAVENOUS | Status: AC
  Administered 2016-01-29: 1000 mL via INTRAVENOUS

## 2016-01-29 NOTE — ED Notes (Addendum)
Jane Francis, daughter in law to pick up patient. E signature will not accept.

## 2016-01-29 NOTE — ED Notes (Signed)
Spoke with patients daughter Sandre Kitty who states she will come to see her mother with her aunt around 40am and pick her up.

## 2016-01-29 NOTE — ED Notes (Signed)
Pt alert and oriented X4, active, cooperative, pt in NAD. RR even and unlabored, color WNL.  Pt informed to return if any life threatening symptoms occur.   

## 2016-01-29 NOTE — Discharge Instructions (Signed)
As we discussed, your workup today was reassuring except for a mild urinary tract infection.  We believe that you probably took too much pain medicine between your Flexeril, Norco, trazodone, and other medications.  It is safe to go home at this time, but we encourage you to follow-up with your regular doctor, or consider following up with [facility] for issues with medication or drug dependence.  Return to the emergency department with new or worsening symptoms that concern you.   Accidental Overdose A drug overdose occurs when a chemical substance (drug or medication) is used in amounts large enough to overcome a person. This may result in severe illness or death. This is a type of poisoning. Accidental overdoses of medications or other substances come from a variety of reasons. When this happens accidentally, it is often because the person taking the substance does not know enough about what they have taken. Drugs which commonly cause overdose deaths are alcohol, psychotropic medications (medications which affect the mind), pain medications, illegal drugs (street drugs) such as cocaine and heroin, and multiple drugs taken at the same time. It may result from careless behavior (such as over-indulging at a party). Other causes of overdose may include multiple drug use, a lapse in memory, or drug use after a period of no drug use.  Sometimes overdosing occurs because a person cannot remember if they have taken their medication.  A common unintentional overdose in young children involves multi-vitamins containing iron. Iron is a part of the hemoglobin molecule in blood. It is used to transport oxygen to living cells. When taken in small amounts, iron allows the body to restock hemoglobin. In large amounts, it causes problems in the body. If this overdose is not treated, it can lead to death. Never take medicines that show signs of tampering or do not seem quite right. Never take medicines in the dark or in poor  lighting. Read the label and check each dose of medicine before you take it. When adults are poisoned, it happens most often through carelessness or lack of information. Taking medicines in the dark or taking medicine prescribed for someone else to treat the same type of problem is a dangerous practice. SYMPTOMS  Symptoms of overdose depend on the medication and amount taken. They can vary from over-activity with stimulant over-dosage, to sleepiness from depressants such as alcohol, narcotics and tranquilizers. Confusion, dizziness, nausea and vomiting may be present. If problems are severe enough coma and death may result. DIAGNOSIS  Diagnosis and management are generally straightforward if the drug is known. Otherwise it is more difficult. At times, certain symptoms and signs exhibited by the patient, or blood tests, can reveal the drug in question.  TREATMENT  In an emergency department, most patients can be treated with supportive measures. Antidotes may be available if there has been an overdose of opioids or benzodiazepines. A rapid improvement will often occur if this is the cause of overdose. At home or away from medical care:  There may be no immediate problems or warning signs in children.  Not everything works well in all cases of poisoning.  Take immediate action. Poisons may act quickly.  If you think someone has swallowed medicine or a household product, and the person is unconscious, having seizures (convulsions), or is not breathing, immediately call for an ambulance. IF a person is conscious and appears to be doing OK but has swallowed a poison:  Do not wait to see what effect the poison will have. Immediately call  a poison control center (listed in the white pages of your telephone book under "Poison Control" or inside the front cover with other emergency numbers). Some poison control centers have TTY capability for the deaf. Check with your local center if you or someone in your  family requires this service.  Keep the container so you can read the label on the product for ingredients.  Describe what, when, and how much was taken and the age and condition of the person poisoned. Inform them if the person is vomiting, choking, drowsy, shows a change in color or temperature of skin, is conscious or unconscious, or is convulsing.  Do not cause vomiting unless instructed by medical personnel. Do not induce vomiting or force liquids into a person who is convulsing, unconscious, or very drowsy. Stay calm and in control.   Activated charcoal also is sometimes used in certain types of poisoning and you may wish to add a supply to your emergency medicines. It is available without a prescription. Call a poison control center before using this medication. PREVENTION  Thousands of children die every year from unintentional poisoning. This may be from household chemicals, poisoning from carbon monoxide in a car, taking their parent's medications, or simply taking a few iron pills or vitamins with iron. Poisoning comes from unexpected sources.  Store medicines out of the sight and reach of children, preferably in a locked cabinet. Do not keep medications in a food cabinet. Always store your medicines in a secure place. Get rid of expired medications.  If you have children living with you or have them as occasional guests, you should have child-resistant caps on your medicine containers. Keep everything out of reach. Child proof your home.  If you are called to the telephone or to answer the door while you are taking a medicine, take the container with you or put the medicine out of the reach of small children.  Do not take your medication in front of children. Do not tell your child how good a medication is and how good it is for them. They may get the idea it is more of a treat.  If you are an adult and have accidentally taken an overdose, you need to consider how this happened and  what can be done to prevent it from happening again. If this was from a street drug or alcohol, determine if there is a problem that needs addressing. If you are not sure a problems exists, it is easy to talk to a professional and ask them if they think you have a problem. It is better to handle this problem in this way before it happens again and has a much worse consequence.   This information is not intended to replace advice given to you by your health care provider. Make sure you discuss any questions you have with your health care provider.   Document Released: 12/28/2004 Document Revised: 11/04/2014 Document Reviewed: 04/03/2015 Elsevier Interactive Patient Education 2016 [employer]  Urinary Tract Infection Urinary tract infections (UTIs) can develop anywhere along your urinary tract. Your urinary tract is your body's drainage system for removing wastes and extra water. Your urinary tract includes two kidneys, two ureters, a bladder, and a urethra. Your kidneys are a pair of bean-shaped organs. Each kidney is about the size of your fist. They are located below your ribs, one on each side of your spine. CAUSES Infections are caused by microbes, which are microscopic organisms, including fungi, viruses, and bacteria. These organisms  are so small that they can only be seen through a microscope. Bacteria are the microbes that most commonly cause UTIs. SYMPTOMS  Symptoms of UTIs may vary by age and gender of the patient and by the location of the infection. Symptoms in young women typically include a frequent and intense urge to urinate and a painful, burning feeling in the bladder or urethra during urination. Older women and men are more likely to be tired, shaky, and weak and have muscle aches and abdominal pain. A fever may mean the infection is in your kidneys. Other symptoms of a kidney infection include pain in your back or sides below the ribs, nausea, and vomiting. DIAGNOSIS To diagnose a  UTI, your caregiver will ask you about your symptoms. Your caregiver will also ask you to provide a urine sample. The urine sample will be tested for bacteria and white blood cells. White blood cells are made by your body to help fight infection. TREATMENT  Typically, UTIs can be treated with medication. Because most UTIs are caused by a bacterial infection, they usually can be treated with the use of antibiotics. The choice of antibiotic and length of treatment depend on your symptoms and the type of bacteria causing your infection. HOME CARE INSTRUCTIONS  If you were prescribed antibiotics, take them exactly as your caregiver instructs you. Finish the medication even if you feel better after you have only taken some of the medication.  Drink enough water and fluids to keep your urine clear or pale yellow.  Avoid caffeine, tea, and carbonated beverages. They tend to irritate your bladder.  Empty your bladder often. Avoid holding urine for long periods of time.  Empty your bladder before and after sexual intercourse.  After a bowel movement, women should cleanse from front to back. Use each tissue only once. SEEK MEDICAL CARE IF:   You have back pain.  You develop a fever.  Your symptoms do not begin to resolve within 3 days. SEEK IMMEDIATE MEDICAL CARE IF:   You have severe back pain or lower abdominal pain.  You develop chills.  You have nausea or vomiting.  You have continued burning or discomfort with urination. MAKE SURE YOU:   Understand these instructions.  Will watch your condition.  Will get help right away if you are not doing well or get worse.   This information is not intended to replace advice given to you by your health care provider. Make sure you discuss any questions you have with your health care provider.   Document Released: 07/24/2005 Document Revised: 07/05/2015 Document Reviewed: 11/22/2011 Elsevier Interactive Patient Education [employer].

## 2016-01-30 LAB — URINE CULTURE
CULTURE: NO GROWTH
SPECIAL REQUESTS: NORMAL

## 2016-02-03 ENCOUNTER — Emergency Department
Admission: EM | Admit: 2016-02-03 | Discharge: 2016-02-03 | Disposition: A | Discharge status: Home / Self Care | Payer: Medicaid - Out of State | Attending: Emergency Medicine | Admitting: Emergency Medicine

## 2016-02-03 ENCOUNTER — Emergency Department: Payer: Medicaid - Out of State

## 2016-02-03 DIAGNOSIS — W108XXA Fall (on) (from) other stairs and steps, initial encounter: Secondary | ICD-10-CM | POA: Insufficient documentation

## 2016-02-03 DIAGNOSIS — Y999 Unspecified external cause status: Secondary | ICD-10-CM | POA: Insufficient documentation

## 2016-02-03 DIAGNOSIS — Z85828 Personal history of other malignant neoplasm of skin: Secondary | ICD-10-CM | POA: Insufficient documentation

## 2016-02-03 DIAGNOSIS — Y929 Unspecified place or not applicable: Secondary | ICD-10-CM | POA: Insufficient documentation

## 2016-02-03 DIAGNOSIS — E785 Hyperlipidemia, unspecified: Secondary | ICD-10-CM | POA: Diagnosis not present

## 2016-02-03 DIAGNOSIS — Y939 Activity, unspecified: Secondary | ICD-10-CM | POA: Insufficient documentation

## 2016-02-03 DIAGNOSIS — F17210 Nicotine dependence, cigarettes, uncomplicated: Secondary | ICD-10-CM | POA: Insufficient documentation

## 2016-02-03 DIAGNOSIS — S92252A Displaced fracture of navicular [scaphoid] of left foot, initial encounter for closed fracture: Secondary | ICD-10-CM | POA: Insufficient documentation

## 2016-02-03 DIAGNOSIS — M79672 Pain in left foot: Secondary | ICD-10-CM | POA: Diagnosis present

## 2016-02-03 MED ORDER — HYDROCODONE-ACETAMINOPHEN 5-325 MG PO TABS
2.0000 | ORAL_TABLET | Freq: Once | ORAL | Status: AC
  Administered 2016-02-03: 2 via ORAL
  Filled 2016-02-03: qty 2

## 2016-02-03 MED ORDER — OXYCODONE-ACETAMINOPHEN 5-325 MG PO TABS: 1.0000 | ORAL_TABLET | ORAL | Status: AC | PRN

## 2016-02-03 NOTE — ED Notes (Signed)
Patient to ER for left foot injury. Patient states she fell down two steps last night and felt a pop in her foot. States she is now having pain and swelling. Ambulatory to stat desk, but has slipper on injured foot.

## 2016-02-03 NOTE — ED Provider Notes (Signed)
[facility] Emergency Department Provider Note  ____________________________________________  Time seen: Approximately 9:03 PM  I have reviewed the triage vital signs and the nursing notes.   HISTORY  Chief Complaint Foot Pain    HPI Jane Francis is a 50 y.o. female presents for evaluation of left foot pain. Patient states that she slipped and fell down the steps last night and complaining of increased pain today. Unable to bear full weight onto the foot. Planes of increased swelling and bruising.   Past Medical History  Diagnosis Date  . Skin cancer 2012    Dr. Koleen Nimrod  . Hyperlipidemia     Patient Active Problem List   Diagnosis Date Noted  . Hyperlipidemia 06/28/2011    Past Surgical History  Procedure Laterality Date  . Appendectomy    . Abdominal hysterectomy  1995    ovaries intact, uterine prolapse  . Vaginal delivery      x3    Current Outpatient Rx  Name  Route  Sig  Dispense  Refill  . EXPIRED: atorvastatin (LIPITOR) 20 MG tablet   Oral   Take 1 tablet (20 mg total) by mouth daily.   30 tablet   11   . cephALEXin (KEFLEX) 500 MG capsule   Oral   Take 1 capsule (500 mg total) by mouth 2 (two) times daily.   14 capsule   0   . oxyCODONE-acetaminophen (ROXICET) 5-325 MG tablet   Oral   Take 1-2 tablets by mouth every 4 (four) hours as needed for severe pain.   15 tablet   0   . EXPIRED: sertraline (ZOLOFT) 25 MG tablet   Oral   Take 1 tablet (25 mg total) by mouth daily.   30 tablet   2   . zolpidem (AMBIEN) 10 MG tablet   Oral   Take 10 mg by mouth at bedtime as needed for sleep.           Allergies Review of patient's allergies indicates no known allergies.  Family History  Problem Relation Age of Onset  . Thrombosis Mother   . Cancer Father     throat  . Heart disease Father   . Stroke Maternal Grandmother     Social History Social History  Substance Use Topics  . Smoking status: Current Every  Day Smoker -- 1.00 packs/day for 31 years    Types: Cigarettes  . Smokeless tobacco: Never Used  . Alcohol Use: No    Review of Systems Constitutional: No fever/chills Musculoskeletal: Positive for left foot pain Skin: Negative for rash. Neurological: Negative for headaches, focal weakness or numbness.  10-point ROS otherwise negative.  ____________________________________________   PHYSICAL EXAM:  VITAL SIGNS: ED Triage Vitals  Enc Vitals Group     BP 02/03/16 1925 129/63 mmHg     Pulse Rate 02/03/16 1925 18     Resp 02/03/16 1925 16     Temp 02/03/16 1925 98.8 F (37.1 C)     Temp Source 02/03/16 1925 Oral     SpO2 02/03/16 1925 99 %     Weight 02/03/16 1925 132 lb (59.875 kg)     Height 02/03/16 1925 5\' 6"  (1.676 m)     Head Cir --      Peak Flow --      Pain Score 02/03/16 1927 9     Pain Loc --      Pain Edu? --      Excl. in [place]? --  Constitutional: Alert and oriented. Well appearing and in no acute distress. Musculoskeletal: Left foot with limited range of motion increased ecchymosis and bruising noted positive edema. Distally neurovascularly intact. When tenderness to the dorsal aspect of the foot Neurologic:  Normal speech and language. No gross focal neurologic deficits are appreciated. No gait instability. Skin:  Skin is warm, dry and intact. No rash noted. Psychiatric: Mood and affect are normal. Speech and behavior are normal.  ____________________________________________   LABS (all labs ordered are listed, but only abnormal results are displayed)  Labs Reviewed - No data to display ____________________________________________  RADIOLOGY  Positive for navicular avulsion fracture left foot. ____________________________________________   PROCEDURES  Procedure(s) performed: None  Critical Care performed: No  ____________________________________________   INITIAL IMPRESSION / ASSESSMENT AND PLAN / ED COURSE  Pertinent labs & imaging  results that were available during my care of the patient were reviewed by me and considered in my medical decision making (see chart for details).  Navicular fracture left foot. Rx given for Percocet 5/325 and referral given to orthopedics on call Dr. Mack Guise. She voices no other emergency medical complaints at this time. Patient placed in a posterior foot splint with crutches. ____________________________________________   FINAL CLINICAL IMPRESSION(S) / ED DIAGNOSES  Final diagnoses:  Navicular fracture, left, closed, initial encounter     This chart was dictated using voice recognition software/Dragon. Despite best efforts to proofread, errors can occur which can change the meaning. Any change was purely unintentional.   Arlyss Repress, PA-C 02/03/16 2113  Delman Kitten, MD 02/04/16 [id number]

## 2016-02-03 NOTE — Discharge Instructions (Signed)
Cast or Splint Care °Casts and splints support injured limbs and keep bones from moving while they heal. It is important to care for your cast or splint at home.   °HOME CARE INSTRUCTIONS °· Keep the cast or splint uncovered during the drying period. It can take 24 to 48 hours to dry if it is made of plaster. A fiberglass cast will dry in less than 1 hour. °· Do not rest the cast on anything harder than a pillow for the first 24 hours. °· Do not put weight on your injured limb or apply pressure to the cast until your health care provider gives you permission. °· Keep the cast or splint dry. Wet casts or splints can lose their shape and may not support the limb as well. A wet cast that has lost its shape can also create harmful pressure on your skin when it dries. Also, wet skin can become infected. °· Cover the cast or splint with a plastic bag when bathing or when out in the rain or snow. If the cast is on the trunk of the body, take sponge baths until the cast is removed. °· If your cast does become wet, dry it with a towel or a blow dryer on the cool setting only. °· Keep your cast or splint clean. Soiled casts may be wiped with a moistened cloth. °· Do not place any hard or soft foreign objects under your cast or splint, such as cotton, toilet paper, lotion, or powder. °· Do not try to scratch the skin under the cast with any object. The object could get stuck inside the cast. Also, scratching could lead to an infection. If itching is a problem, use a blow dryer on a cool setting to relieve discomfort. °· Do not trim or cut your cast or remove padding from inside of it. °· Exercise all joints next to the injury that are not immobilized by the cast or splint. For example, if you have a long leg cast, exercise the hip joint and toes. If you have an arm cast or splint, exercise the shoulder, elbow, thumb, and fingers. °· Elevate your injured arm or leg on 1 or 2 pillows for the first 1 to 3 days to decrease  swelling and pain. It is best if you can comfortably elevate your cast so it is higher than your heart. °SEEK MEDICAL CARE IF:  °· Your cast or splint cracks. °· Your cast or splint is too tight or too loose. °· You have unbearable itching inside the cast. °· Your cast becomes wet or develops a soft spot or area. °· You have a bad smell coming from inside your cast. °· You get an object stuck under your cast. °· Your skin around the cast becomes red or raw. °· You have new pain or worsening pain after the cast has been applied. °SEEK IMMEDIATE MEDICAL CARE IF:  °· You have fluid leaking through the cast. °· You are unable to move your fingers or toes. °· You have discolored (blue or white), cool, painful, or very swollen fingers or toes beyond the cast. °· You have tingling or numbness around the injured area. °· You have severe pain or pressure under the cast. °· You have any difficulty with your breathing or have shortness of breath. °· You have chest pain. °  °This information is not intended to replace advice given to you by your health care provider. Make sure you discuss any questions you have with your health care   provider.   Document Released: 10/11/2000 Document Revised: 08/04/2013 Document Reviewed: 04/22/2013 Elsevier Interactive Patient Education 2016 Elsevier Inc.  Tarsal Navicular Fracture A tarsal navicular fracture is a break in the navicular bone in your foot. The navicular bone is at the top of the middle of your foot. It is one of the bones in a group of bones called the tarsal bones. The navicular bone is wedged between other bones. Running and jumping put a lot of pressure on your navicular bone. Tarsal navicular fractures occur most often in athletes.  CAUSES  A tarsal navicular fracture can be caused by:  Severe twisting of your foot.  Something heavy falling on your foot.  Stress on the navicular bone from your foot striking the ground repeatedly (stress fracture). RISK  FACTORS You may be at risk for a navicular fracture if you participate in high-impact activities such as:  Track and field.  Football.  Soccer.  Basketball.  Gymnastics.  Ballet dancing. Other risk factors include:  Being a woman with an irregular menstrual cycle.  Having a condition that causes your bones to become thin and brittle (osteoporosis).  Being a smoker.  Starting a new sport without being in good shape.  Wearing athletic shoes that do not fit well. SIGNS AND SYMPTOMS An aching pain at the top of your foot is the most common symptom. The pain may move down into the arch of your foot. The pain will get worse with activity and better with rest. Other symptoms may include:  Swelling on the top of your foot.  Pain when pressing on the top of your foot.  Pain when hopping on your foot. DIAGNOSIS  Your health care provider may suspect a tarsal navicular fracture if you recently injured your foot and have symptoms of a fracture. A physical exam will be done. During this exam, your health care provider may move your foot into different positions to check for pain. If you have pain when the health care provider presses on your navicular bone, then it is very likely that you have a navicular fracture. An X-ray of your foot may be done to help confirm the diagnosis. Regular X-rays often do not show a stress fracture. You may need to have other imaging studies, such as:  A bone scan.  A CT scan.  An MRI. TREATMENT  Your health care provider will determine the best treatment for you based on the severity of your fracture.   If the broken bone is in good alignment, a cast or splint may be applied. The cast or splint will likely need to be worn for several weeks. While the cast or splint is on, you cannot put weight on your foot. You will need close follow-up with your health care provider to make sure you are healing.  Rarely, if the fracture is severe and the broken bone  is out of place, your health care provider will need to align the fracture using a surgical procedure called open reduction and internal fixation (ORIF).  In the ORIF procedure, the fracture site is opened up, and the bone pieces are fixed into place with metal screws or pins.  After surgery, you may need to wear a cast or splint. You will also need close follow-up with your health care provider to make sure you are healing. HOME CARE INSTRUCTIONS   Use crutches as directed by your health care provider. Do not put weight on your injured foot until your health care provider approves.  If you have a plaster or fiberglass cast:   Do not try to scratch the skin under the cast with sharp or pointed objects.  Check the skin around the cast every day. You may put lotion on any red or sore areas.   Keep your cast dry and clean.  Use a plastic bag to protect your cast or splint from water while bathing. Do not lower your cast or splint into water.  Take medicines only as directed by your health care provider.  Keep all follow-up visits as directed by your health care provider. This is important. SEEK MEDICAL CARE IF:   You have very bad pain, and medicine is not helping.  You have more than a small spot of bleeding from under your cast or splint.  You have drainage, redness, or swelling at the injury site.  You have a fever.  You notice a bad smell coming from your cast or splint.   Your cast or splint cracks, breaks, or gets wet. SEEK IMMEDIATE MEDICAL CARE IF:   You begin to lose feeling in your foot or toes.  You have swelling in your foot or toes that is increasing.  Your foot or toes feel cold or turn blue.  You develop a rash.  MAKE SURE YOU:  Understand these instructions.  Will watch your condition.  Will get help right away if you are not doing well or get worse.   This information is not intended to replace advice given to you by your health care provider.  Make sure you discuss any questions you have with your health care provider.   Document Released: 01/26/2001 Document Revised: 11/04/2014 Document Reviewed: 12/17/2013 Elsevier Interactive Patient Education [employer].

## 2017-08-09 IMAGING — CT CT HEAD W/O CM
1 series · 16 of 30 positions shown, 20 images · non-contrast
Comparison: None.

CLINICAL DATA: Acute onset of altered mental status and slurred
speech. Initial encounter.

EXAM:
CT HEAD WITHOUT CONTRAST
TECHNIQUE: Contiguous axial images were obtained from the base of the skull
through the vertex without intravenous contrast.

[Series 2: head wo · axial · 0.47mm/px · z∈[-143,-17]mm · 16 of 32 slices shown, 20 images]
[im 2/32  brain]
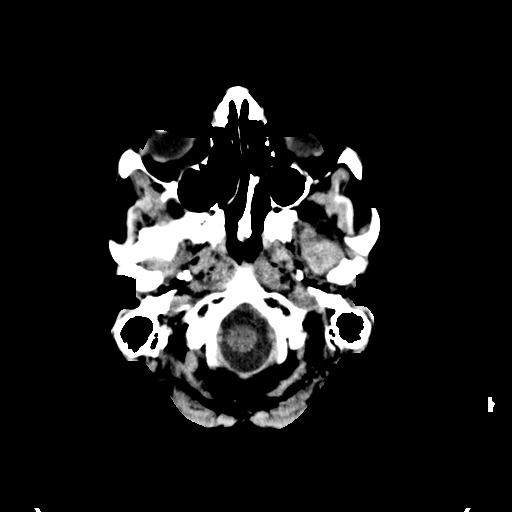
[im 2/32  bone]
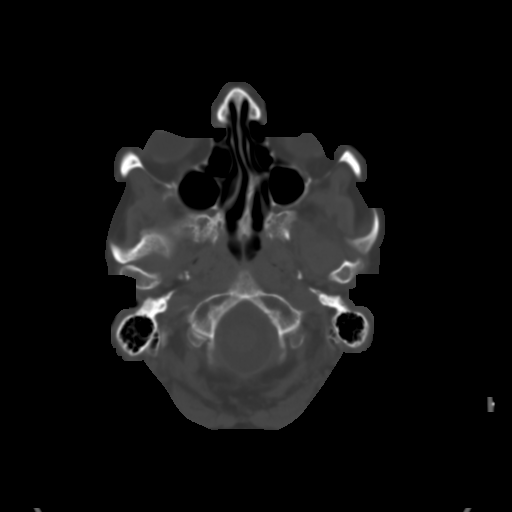
[im 4/32  brain]
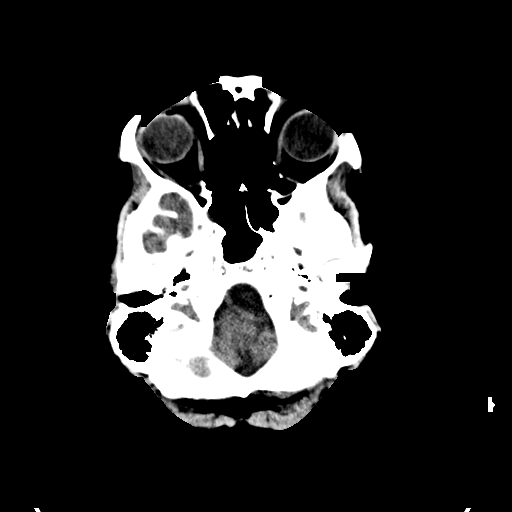
[im 6/32  brain]
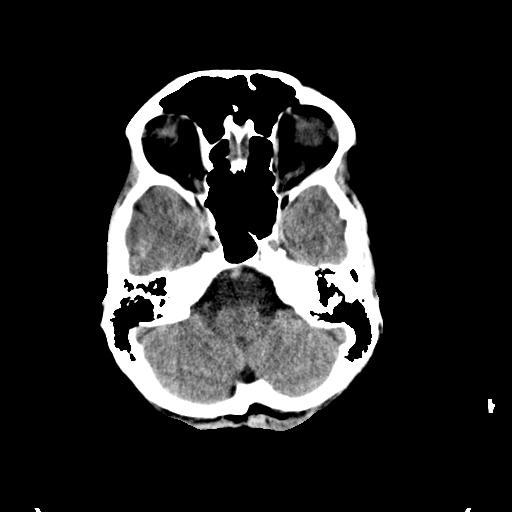
[im 8/32  brain]
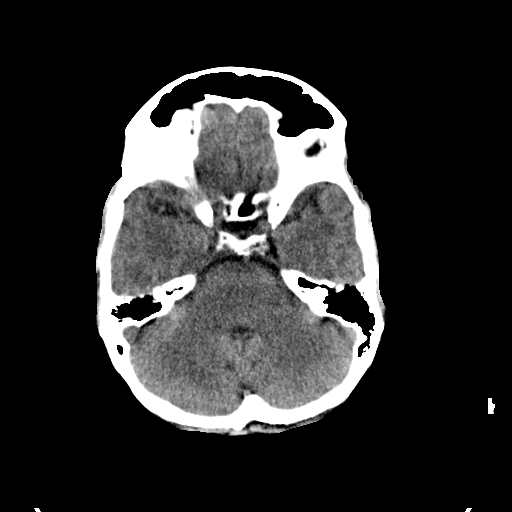
[im 9/32  brain]
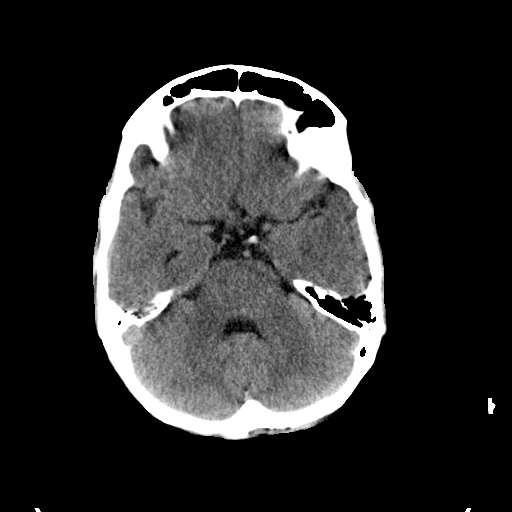
[im 9/32  bone]
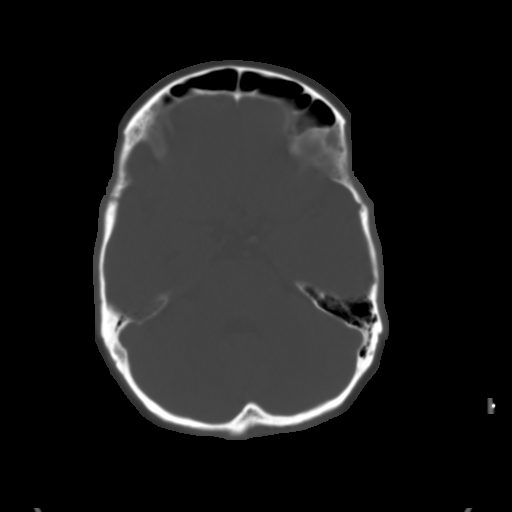
[im 11/32  brain]
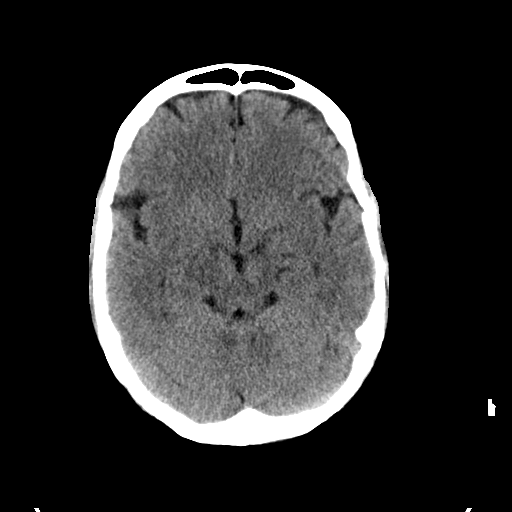
[im 13/32  brain]
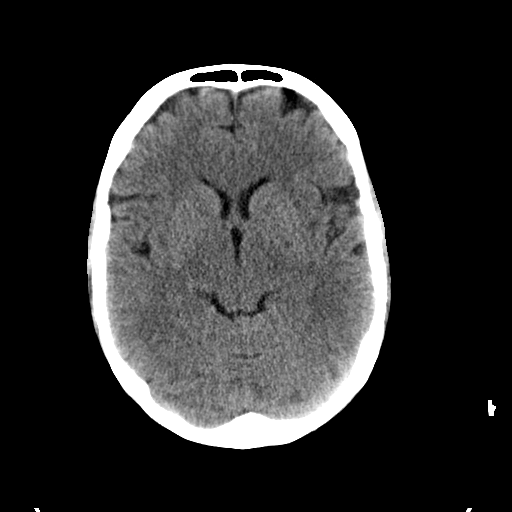
[im 15/32  brain]
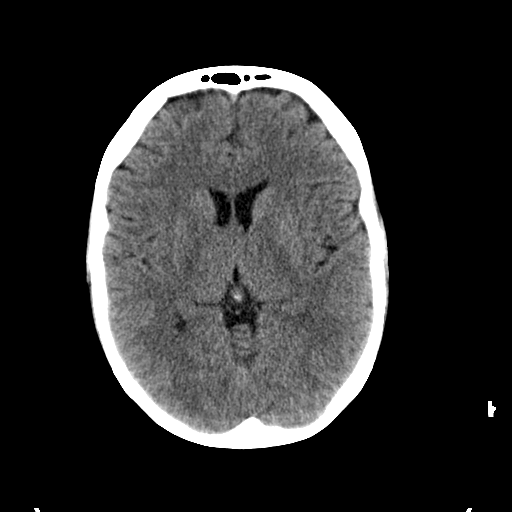
[im 17/32  brain]
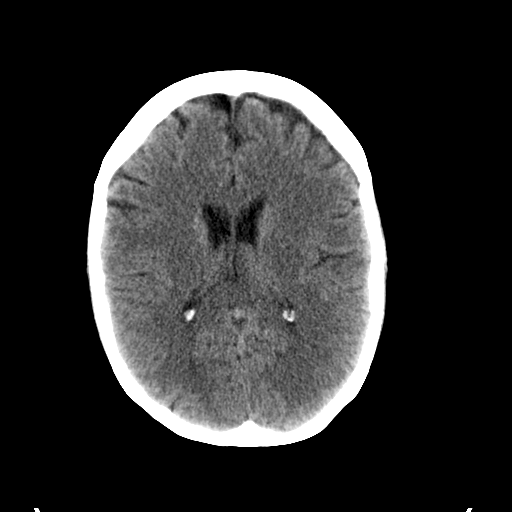
[im 17/32  bone]
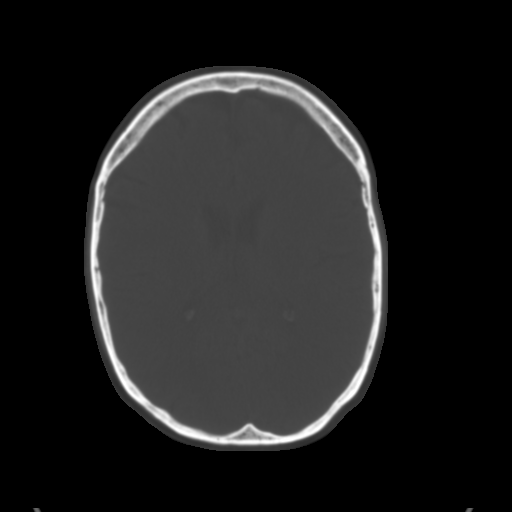
[im 19/32  brain]
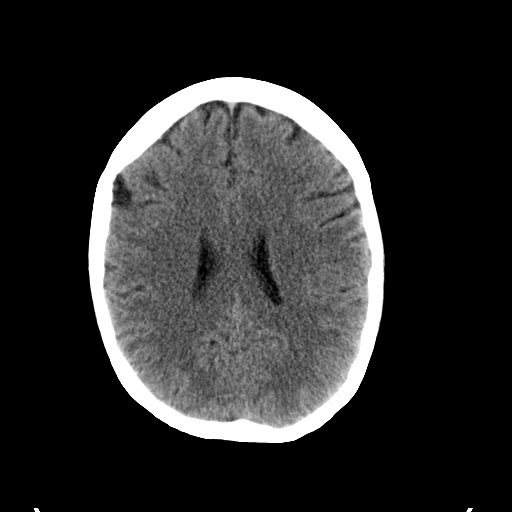
[im 21/32  brain]
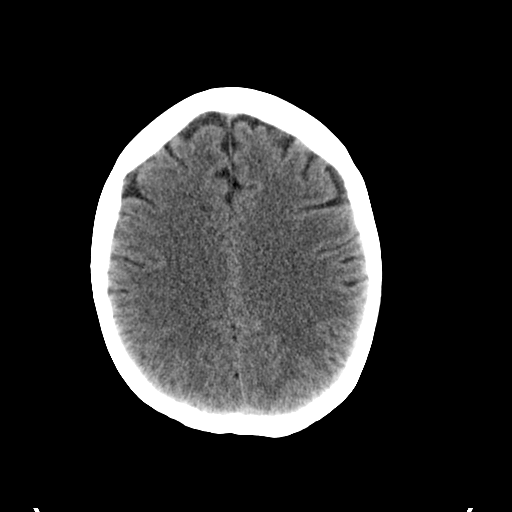
[im 23/32  brain]
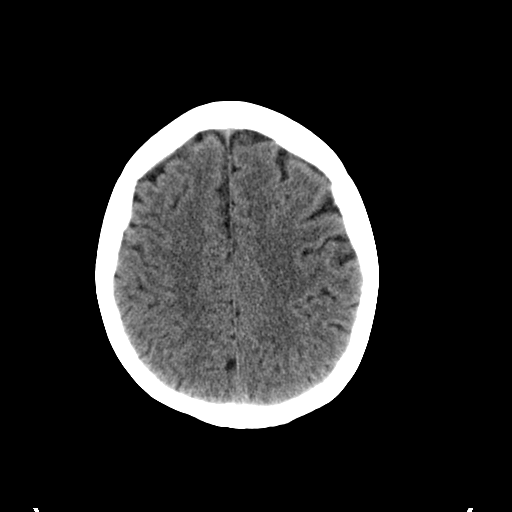
[im 24/32  brain]
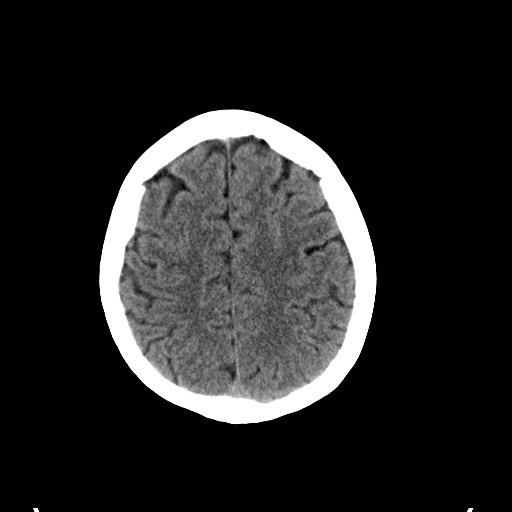
[im 24/32  bone]
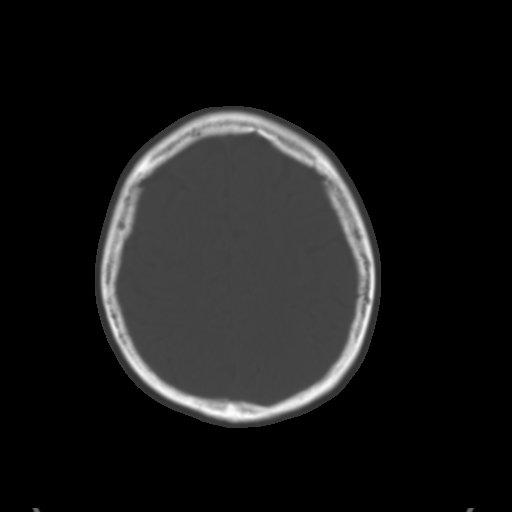
[im 26/32  brain]
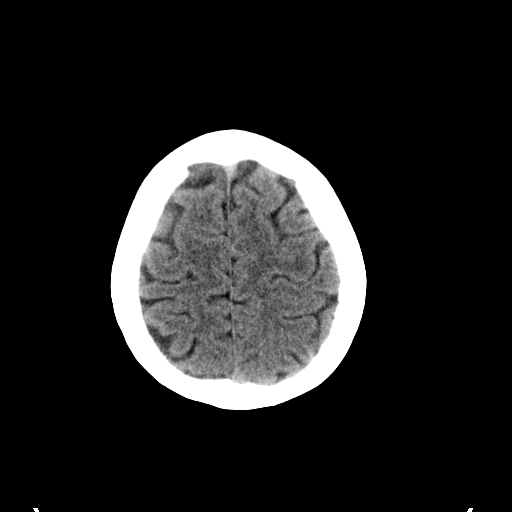
[im 28/32  brain]
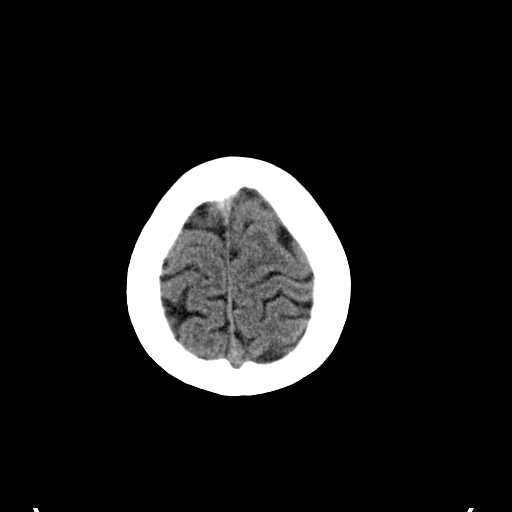
[im 30/32  brain]
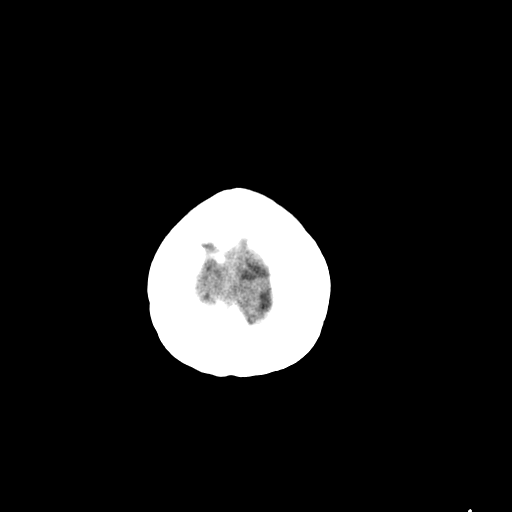

[16 of 30 positions shown; findings below may reference images not displayed]

FINDINGS: There is no evidence of acute infarction, mass lesion, or intra- or
extra-axial hemorrhage on CT.

The posterior fossa, including the cerebellum, brainstem and fourth
ventricle, is within normal limits. The third and lateral
ventricles, and basal ganglia are unremarkable in appearance. The
cerebral hemispheres are symmetric in appearance, with normal
gray-white differentiation. No mass effect or midline shift is seen.

There is no evidence of fracture; visualized osseous structures are
unremarkable in appearance. The visualized portions of the orbits
are within normal limits. The paranasal sinuses and mastoid air
cells are well-aerated. No significant soft tissue abnormalities are
seen.
IMPRESSION: Unremarkable noncontrast CT of the head.
